# Patient Record
Sex: Male | Born: 1935 | Race: White | Hispanic: No | Marital: Single | Smoking: Former smoker
Health system: Southern US, Community
[De-identification: ages and names within clinical notes are randomized; demographics above are authoritative.]

## PROBLEM LIST (undated history)

## (undated) DIAGNOSIS — K219 Gastro-esophageal reflux disease without esophagitis: Secondary | ICD-10-CM

## (undated) DIAGNOSIS — I4891 Unspecified atrial fibrillation: Secondary | ICD-10-CM

## (undated) HISTORY — PX: PACEMAKER PLACEMENT: SHX43

---

## 2015-08-17 ENCOUNTER — Observation Stay
Admission: EM | Admit: 2015-08-17 | Discharge: 2015-08-18 | Disposition: A | Discharge status: Home / Self Care | Payer: Medicare Other | Attending: Specialist | Admitting: Specialist

## 2015-08-17 ENCOUNTER — Encounter: Payer: Self-pay | Admitting: Emergency Medicine

## 2015-08-17 ENCOUNTER — Emergency Department: Payer: Medicare Other

## 2015-08-17 DIAGNOSIS — R05 Cough: Secondary | ICD-10-CM | POA: Diagnosis not present

## 2015-08-17 DIAGNOSIS — I482 Chronic atrial fibrillation: Secondary | ICD-10-CM | POA: Diagnosis not present

## 2015-08-17 DIAGNOSIS — I10 Essential (primary) hypertension: Secondary | ICD-10-CM | POA: Insufficient documentation

## 2015-08-17 DIAGNOSIS — E785 Hyperlipidemia, unspecified: Secondary | ICD-10-CM | POA: Insufficient documentation

## 2015-08-17 DIAGNOSIS — Z87891 Personal history of nicotine dependence: Secondary | ICD-10-CM | POA: Insufficient documentation

## 2015-08-17 DIAGNOSIS — Z885 Allergy status to narcotic agent status: Secondary | ICD-10-CM | POA: Diagnosis not present

## 2015-08-17 DIAGNOSIS — J111 Influenza due to unidentified influenza virus with other respiratory manifestations: Secondary | ICD-10-CM | POA: Diagnosis not present

## 2015-08-17 DIAGNOSIS — R51 Headache: Secondary | ICD-10-CM | POA: Insufficient documentation

## 2015-08-17 DIAGNOSIS — N400 Enlarged prostate without lower urinary tract symptoms: Secondary | ICD-10-CM | POA: Diagnosis not present

## 2015-08-17 DIAGNOSIS — I2510 Atherosclerotic heart disease of native coronary artery without angina pectoris: Secondary | ICD-10-CM | POA: Insufficient documentation

## 2015-08-17 DIAGNOSIS — F329 Major depressive disorder, single episode, unspecified: Secondary | ICD-10-CM | POA: Insufficient documentation

## 2015-08-17 DIAGNOSIS — R531 Weakness: Secondary | ICD-10-CM | POA: Insufficient documentation

## 2015-08-17 DIAGNOSIS — I517 Cardiomegaly: Secondary | ICD-10-CM | POA: Diagnosis not present

## 2015-08-17 DIAGNOSIS — J069 Acute upper respiratory infection, unspecified: Secondary | ICD-10-CM | POA: Diagnosis present

## 2015-08-17 DIAGNOSIS — Z888 Allergy status to other drugs, medicaments and biological substances status: Secondary | ICD-10-CM | POA: Insufficient documentation

## 2015-08-17 DIAGNOSIS — R509 Fever, unspecified: Secondary | ICD-10-CM | POA: Insufficient documentation

## 2015-08-17 DIAGNOSIS — R0602 Shortness of breath: Secondary | ICD-10-CM | POA: Insufficient documentation

## 2015-08-17 DIAGNOSIS — Z950 Presence of cardiac pacemaker: Secondary | ICD-10-CM | POA: Diagnosis not present

## 2015-08-17 DIAGNOSIS — Z880 Allergy status to penicillin: Secondary | ICD-10-CM | POA: Diagnosis not present

## 2015-08-17 DIAGNOSIS — J209 Acute bronchitis, unspecified: Secondary | ICD-10-CM | POA: Diagnosis present

## 2015-08-17 DIAGNOSIS — K219 Gastro-esophageal reflux disease without esophagitis: Secondary | ICD-10-CM | POA: Insufficient documentation

## 2015-08-17 HISTORY — DX: Unspecified atrial fibrillation: I48.91

## 2015-08-17 HISTORY — DX: Gastro-esophageal reflux disease without esophagitis: K21.9

## 2015-08-17 LAB — BASIC METABOLIC PANEL
ANION GAP: 4 — AB (ref 5–15)
BUN: 13 mg/dL (ref 6–20)
CALCIUM: 8.2 mg/dL — AB (ref 8.9–10.3)
CO2: 24 mmol/L (ref 22–32)
Chloride: 107 mmol/L (ref 101–111)
Creatinine, Ser: 0.98 mg/dL (ref 0.61–1.24)
Glucose, Bld: 115 mg/dL — ABNORMAL HIGH (ref 65–99)
Potassium: 3.9 mmol/L (ref 3.5–5.1)
SODIUM: 135 mmol/L (ref 135–145)

## 2015-08-17 LAB — CBC
HCT: 36.9 % — ABNORMAL LOW (ref 40.0–52.0)
HEMOGLOBIN: 12.5 g/dL — AB (ref 13.0–18.0)
MCH: 33.3 pg (ref 26.0–34.0)
MCHC: 33.8 g/dL (ref 32.0–36.0)
MCV: 98.5 fL (ref 80.0–100.0)
Platelets: 119 10*3/uL — ABNORMAL LOW (ref 150–440)
RBC: 3.74 MIL/uL — AB (ref 4.40–5.90)
RDW: 12.9 % (ref 11.5–14.5)
WBC: 8.1 10*3/uL (ref 3.8–10.6)

## 2015-08-17 LAB — URINALYSIS COMPLETE WITH MICROSCOPIC (ARMC ONLY)
BILIRUBIN URINE: NEGATIVE
Bacteria, UA: NONE SEEN
Glucose, UA: NEGATIVE mg/dL
Hgb urine dipstick: NEGATIVE
KETONES UR: NEGATIVE mg/dL
Leukocytes, UA: NEGATIVE
NITRITE: NEGATIVE
PH: 6 (ref 5.0–8.0)
PROTEIN: NEGATIVE mg/dL
SPECIFIC GRAVITY, URINE: 1.018 (ref 1.005–1.030)
Squamous Epithelial / LPF: NONE SEEN

## 2015-08-17 LAB — RAPID INFLUENZA A&B ANTIGENS
Influenza A (ARMC): NEGATIVE
Influenza B (ARMC): NEGATIVE

## 2015-08-17 LAB — TROPONIN I: Troponin I: 0.03 ng/mL (ref <0.031)

## 2015-08-17 LAB — LACTIC ACID, PLASMA: LACTIC ACID, VENOUS: 1.6 mmol/L (ref 0.5–2.0)

## 2015-08-17 MED ORDER — TOPIRAMATE 25 MG PO TABS
50.0000 mg | ORAL_TABLET | Freq: Every day | ORAL | Status: DC
  Administered 2015-08-18: 10:00:00 50 mg via ORAL
  Filled 2015-08-17: qty 2

## 2015-08-17 MED ORDER — ESCITALOPRAM OXALATE 10 MG PO TABS
10.0000 mg | ORAL_TABLET | Freq: Every day | ORAL | Status: DC
  Administered 2015-08-18: 10 mg via ORAL
  Filled 2015-08-17: qty 1

## 2015-08-17 MED ORDER — ALBUTEROL SULFATE (2.5 MG/3ML) 0.083% IN NEBU: 2.5000 mg | INHALATION_SOLUTION | RESPIRATORY_TRACT | Status: DC | PRN

## 2015-08-17 MED ORDER — ACETAMINOPHEN 325 MG PO TABS
650.0000 mg | ORAL_TABLET | Freq: Four times a day (QID) | ORAL | Status: DC | PRN
  Administered 2015-08-18: 650 mg via ORAL
  Filled 2015-08-17: qty 2

## 2015-08-17 MED ORDER — SODIUM CHLORIDE 0.9 % IV SOLN
250.0000 mL | INTRAVENOUS | Status: DC | PRN
Start: 2015-08-17 — End: 2015-08-18

## 2015-08-17 MED ORDER — PREGABALIN 50 MG PO CAPS
50.0000 mg | ORAL_CAPSULE | Freq: Two times a day (BID) | ORAL | Status: DC
  Administered 2015-08-18 (×2): 50 mg via ORAL
  Filled 2015-08-17 (×2): qty 1

## 2015-08-17 MED ORDER — SODIUM CHLORIDE 0.9% FLUSH
3.0000 mL | INTRAVENOUS | Status: DC | PRN
Start: 2015-08-17 — End: 2015-08-18

## 2015-08-17 MED ORDER — ACETAMINOPHEN 650 MG RE SUPP: 650.0000 mg | Freq: Four times a day (QID) | RECTAL | Status: DC | PRN

## 2015-08-17 MED ORDER — ONDANSETRON HCL 4 MG PO TABS: 4.0000 mg | ORAL_TABLET | Freq: Four times a day (QID) | ORAL | Status: DC | PRN

## 2015-08-17 MED ORDER — GUAIFENESIN 100 MG/5ML PO SOLN
5.0000 mL | ORAL | Status: DC | PRN
  Administered 2015-08-18: 5 mL via ORAL
  Filled 2015-08-17: qty 10

## 2015-08-17 MED ORDER — ONDANSETRON HCL 4 MG/2ML IJ SOLN: 4.0000 mg | Freq: Four times a day (QID) | INTRAMUSCULAR | Status: DC | PRN

## 2015-08-17 MED ORDER — SIMVASTATIN 40 MG PO TABS: 40.0000 mg | ORAL_TABLET | Freq: Every evening | ORAL | Status: DC

## 2015-08-17 MED ORDER — TAMSULOSIN HCL 0.4 MG PO CAPS
0.4000 mg | ORAL_CAPSULE | Freq: Two times a day (BID) | ORAL | Status: DC
  Administered 2015-08-18 (×2): 0.4 mg via ORAL
  Filled 2015-08-17: qty 1

## 2015-08-17 MED ORDER — PIPERACILLIN-TAZOBACTAM 3.375 G IVPB 30 MIN
3.3750 g | Freq: Once | INTRAVENOUS | Status: DC
  Filled 2015-08-17: qty 50

## 2015-08-17 MED ORDER — SODIUM CHLORIDE 0.9% FLUSH
3.0000 mL | Freq: Two times a day (BID) | INTRAVENOUS | Status: DC
  Administered 2015-08-18 (×2): 3 mL via INTRAVENOUS

## 2015-08-17 MED ORDER — PANTOPRAZOLE SODIUM 20 MG PO TBEC: 20.0000 mg | DELAYED_RELEASE_TABLET | Freq: Every day | ORAL | Status: DC

## 2015-08-17 MED ORDER — DEXTROSE 5 % IV SOLN
500.0000 mg | INTRAVENOUS | Status: DC
  Administered 2015-08-18: 02:00:00 500 mg via INTRAVENOUS
  Filled 2015-08-17 (×2): qty 500

## 2015-08-17 MED ORDER — IPRATROPIUM-ALBUTEROL 0.5-2.5 (3) MG/3ML IN SOLN
3.0000 mL | Freq: Once | RESPIRATORY_TRACT | Status: AC
  Administered 2015-08-17: 3 mL via RESPIRATORY_TRACT
  Filled 2015-08-17: qty 3

## 2015-08-17 MED ORDER — IPRATROPIUM BROMIDE 0.02 % IN SOLN
RESPIRATORY_TRACT | Status: AC
  Filled 2015-08-17: qty 2.5

## 2015-08-17 MED ORDER — SODIUM CHLORIDE 0.9 % IV BOLUS (SEPSIS)
1000.0000 mL | Freq: Once | INTRAVENOUS | Status: AC
  Administered 2015-08-17: 1000 mL via INTRAVENOUS

## 2015-08-17 MED ORDER — ZOLPIDEM TARTRATE 5 MG PO TABS
10.0000 mg | ORAL_TABLET | Freq: Every evening | ORAL | Status: DC | PRN
  Administered 2015-08-18: 01:00:00 10 mg via ORAL
  Filled 2015-08-17: qty 2

## 2015-08-17 MED ORDER — OSELTAMIVIR PHOSPHATE 75 MG PO CAPS
75.0000 mg | ORAL_CAPSULE | Freq: Two times a day (BID) | ORAL | Status: DC
  Administered 2015-08-18 (×2): 75 mg via ORAL
  Filled 2015-08-17 (×2): qty 1

## 2015-08-17 MED ORDER — ACETAMINOPHEN 500 MG PO TABS
1000.0000 mg | ORAL_TABLET | Freq: Once | ORAL | Status: AC
  Administered 2015-08-17: 1000 mg via ORAL
  Filled 2015-08-17: qty 2

## 2015-08-17 MED ORDER — CARVEDILOL 12.5 MG PO TABS
25.0000 mg | ORAL_TABLET | Freq: Two times a day (BID) | ORAL | Status: DC
  Administered 2015-08-18: 25 mg via ORAL
  Filled 2015-08-17: qty 2

## 2015-08-17 MED ORDER — FINASTERIDE 5 MG PO TABS
5.0000 mg | ORAL_TABLET | Freq: Every day | ORAL | Status: DC
  Administered 2015-08-18: 5 mg via ORAL
  Filled 2015-08-17: qty 1

## 2015-08-17 MED ORDER — VANCOMYCIN HCL IN DEXTROSE 1-5 GM/200ML-% IV SOLN
1000.0000 mg | Freq: Once | INTRAVENOUS | Status: AC
  Administered 2015-08-17: 1000 mg via INTRAVENOUS
  Filled 2015-08-17: qty 200

## 2015-08-17 NOTE — H&P (Addendum)
[facility] - [place] at [facility]   PATIENT NAME: Joshua Rasmussen    MR#:  [id number]  DATE OF BIRTH:  1932/10/18  DATE OF ADMISSION:  08/17/2015  PRIMARY CARE PHYSICIAN: No PCP Per Patient   REQUESTING/REFERRING PHYSICIAN: Minna AntisKevin Paduchowski, MD  CHIEF COMPLAINT:   Chief Complaint  Patient presents with  . Weakness  Fever or chills and weakness for 2 days.  HISTORY OF PRESENT ILLNESS:  Joshua DoomsWilliam Dezarn  is a 82 y.o. male with a known history of A. fib and GERD. The patient started to have cough yesterday. He has fever, chills and generalized weakness today. So he came to ED for further evaluation. He was found to have a fever of 102.1, cough, shortness of breath and wheezing in the ED. Chest x-ray didn't show any infiltrate. His wife recently got a flu. [place] from [place].  PAST MEDICAL HISTORY:   Past Medical History  Diagnosis Date  . Atrial fibrillation (HCC)   . GERD (gastroesophageal reflux disease)     PAST SURGICAL HISTORY:   Past Surgical History  Procedure Laterality Date  . Pacemaker placement      SOCIAL HISTORY:   Social History  Substance Use Topics  . Smoking status: Former Games developermoker  . Smokeless tobacco: Not on file  . Alcohol Use: Yes    FAMILY HISTORY:  History reviewed. No pertinent family history. Both parents deceased.  DRUG ALLERGIES:   Allergies  Allergen Reactions  . Amiodarone   . Codeine   . Penicillins Rash    REVIEW OF SYSTEMS:  CONSTITUTIONAL: Has fever, chills and generalized weakness.  EYES: No blurred or double vision.  EARS, NOSE, AND THROAT: No tinnitus or ear pain.  RESPIRATORY: Has cough, shortness of breath, wheezing but no hemoptysis.  CARDIOVASCULAR: No chest pain, orthopnea, edema.  GASTROINTESTINAL: No nausea, vomiting, diarrhea or abdominal pain.  GENITOURINARY: No dysuria, hematuria.  ENDOCRINE: No polyuria, nocturia,  HEMATOLOGY: No anemia, easy bruising or bleeding SKIN:  No rash or lesion. MUSCULOSKELETAL: No joint pain or arthritis.   NEUROLOGIC: No tingling, numbness, weakness.  PSYCHIATRY: No anxiety or depression.   MEDICATIONS AT HOME:   Prior to Admission medications   Not on File      VITAL SIGNS:  Blood pressure 111/80, pulse 70, temperature 102.1 F (38.9 C), temperature source Oral, resp. rate 18, height 6\' 4"  (1.93 m), weight 125.556 kg (276 lb 12.8 oz), SpO2 94 %.  PHYSICAL EXAMINATION:  GENERAL:  82 y.o.-year-old patient lying in the bed with no acute distress. Morbidly obese. EYES: Pupils equal, round, reactive to light and accommodation. No scleral icterus. Extraocular muscles intact.  HEENT: Head atraumatic, normocephalic. Oropharynx and nasopharynx clear.  NECK:  Supple, no jugular venous distention. No thyroid enlargement, no tenderness.  LUNGS: Normal breath sounds bilaterally, mild expiratory wheezing, no rales,rhonchi or crepitation. No use of accessory muscles of respiration.  CARDIOVASCULAR: S1, S2 normal. No murmurs, rubs, or gallops.  ABDOMEN: Soft, nontender, nondistended. Bowel sounds present. No organomegaly or mass.  EXTREMITIES: No pedal edema, cyanosis, or clubbing.  NEUROLOGIC: Cranial nerves II through XII are intact. Muscle strength 5/5 in all extremities. Sensation intact. Gait not checked.  PSYCHIATRIC: The patient is alert and oriented x 3.  SKIN: No obvious rash, lesion, or ulcer.   LABORATORY PANEL:   CBC  Recent Labs Lab 08/17/15 1851  WBC 8.1  HGB 12.5*  HCT 36.9*  PLT 119*   ------------------------------------------------------------------------------------------------------------------  Chemistries   Recent Labs Lab 08/17/15  1851  NA 135  K 3.9  CL 107  CO2 24  GLUCOSE 115*  BUN 13  CREATININE 0.98  CALCIUM 8.2*   ------------------------------------------------------------------------------------------------------------------  Cardiac Enzymes  Recent Labs Lab 08/17/15 1851   TROPONINI 0.03   ------------------------------------------------------------------------------------------------------------------  RADIOLOGY:  Dg Chest Portable 1 View  08/17/2015  CLINICAL DATA:  Weakness, shortness of Breath EXAM: PORTABLE CHEST 1 VIEW COMPARISON:  None. FINDINGS: Cardiomegaly is noted. 3 leads cardiac pacemaker in place. No acute infiltrate or pleural effusion. No pulmonary edema. Mild basilar atelectasis. IMPRESSION: Cardiomegaly. Three leads cardiac pacemaker in place. No infiltrate or pulmonary edema. Mild basilar atelectasis. Electronically Signed   By: Natasha Mead M.D.   On: 08/17/2015 19:04    EKG:   Orders placed or performed during the hospital encounter of 08/17/15  . ED EKG  . ED EKG    IMPRESSION AND PLAN:  Patient will be placed for observation.  Acute bronchitis Start Zithromax, DuoNeb when necessary and Robitussin when necessary.  Fever, Possible influenza Check influenza PCR test and start telemetry floor.  History of A. fib status post pacemaker placement. Continue Coreg. Continue Coumadin dose per pharmacy, follow-up INR.   All the records are reviewed and case discussed with ED provider. Management plans discussed with the patient, his wife and they are in agreement.  CODE STATUS: DO NOT RESUSCITATE  TOTAL TIME TAKING CARE OF THIS PATIENT: 48 minutes.    Shaune Pollack M.D on 08/17/2015 at 9:27 PM  Between 7am to 6pm - Pager - [phone]  After 6pm go to [email or web address] - password [email or web address] [facility]  [place] [place]  Office  [phone]  CC: Primary care physician; No PCP Per Patient

## 2015-08-17 NOTE — ED Provider Notes (Signed)
[facility] Emergency Department Provider Note  Time seen: 7:16 PM  I have reviewed the triage vital signs and the nursing notes.   HISTORY  Chief Complaint Weakness    HPI Joshua Rasmussen is a 82 y.o. male with a past medical history with a history of gastric reflux, depression, CAD, hypertension, atrial fibrillation, BPH, chronic headaches, presents the emergency department with generalized weakness and fever. According to the patient he began coughing with a fever yesterday. States his weakness has progressed, today the patient was unable to get off the couch by himself or with his wife helping. EMS was called and brought the patient to the emergency department. Patient states subjective fever at home, cough with occasional sputum production. Denies chest pain. Denies abdominal pain, nausea, vomiting, diarrhea, dysuria. Patient states significant cough and congestion. Describes his weakness as severe.     No past medical history on file.  There are no active problems to display for this patient.   No past surgical history on file.  No current outpatient prescriptions on file.  Allergies Amiodarone and Codeine  No family history on file.  Social History Social History  Substance Use Topics  . Smoking status: Not on file  . Smokeless tobacco: Not on file  . Alcohol Use: Not on file    Review of Systems Constitutional: Subjective fever. Positive for congestion. Cardiovascular: Negative for chest pain. Respiratory: Positive for shortness of breath and cough. Gastrointestinal: Negative for abdominal pain, vomiting and diarrhea. Genitourinary: Negative for dysuria. Neurological: Positive for generalized weakness. No focal deficits per patient. 10-point ROS otherwise negative.  ____________________________________________   PHYSICAL EXAM:  VITAL SIGNS: ED Triage Vitals  Enc Vitals Group     BP 08/17/15 1833 139/57 mmHg     Pulse Rate  08/17/15 1833 70     Resp 08/17/15 1833 20     Temp 08/17/15 1833 101.6 F (38.7 C)     Temp Source 08/17/15 1833 Oral     SpO2 08/17/15 1833 95 %     Weight 08/17/15 1833 276 lb 12.8 oz (125.556 kg)     Height 08/17/15 1833  (1.93 m)     Head Cir --      Peak Flow --      Pain Score --      Pain Loc --      Pain Edu? --      Excl. in GC? --     Constitutional: Alert and oriented. Appears weak, fatigued. But no acute distress. Eyes: Normal exam ENT   Head: Normocephalic and atraumatic.   Mouth/Throat: Mucous membranes are moist. Cardiovascular: Normal rate, regular rhythm.  Respiratory: Tachypnea around 22-24 breaths per minute. Mild diffuse expiratory wheeze. Gastrointestinal: Soft and nontender. No distention. Musculoskeletal: Nontender with normal range of motion in all extremities. Neurologic:  Normal speech and language. No gross focal neurologic deficits Psychiatric: Mood and affect are normal. Speech and behavior are normal.   ____________________________________________    EKG  EKG reviewed and interpreted by myself shows a ventricular paced rhythm at 70 bpm.  ____________________________________________    RADIOLOGY  Chest x-ray shows cardiomegaly, no infiltrate or pulmonary edema.  ____________________________________________    INITIAL IMPRESSION / ASSESSMENT AND PLAN / ED COURSE  Pertinent labs & imaging results that were available during my care of the patient were reviewed by me and considered in my medical decision making (see chart for details).  Patient presents the emergency department cough, congestion, febrile to 101 in  the emergency department. Chest x-ray is negative. Patient has diffuse expiratory wheeze, we will treat with a breathing treatment. Given his fever, cough and congestion I also send an influenza swab. Patient does meet sepsis criteria due to tachypnea and fever. We will cover with antibiotics until the clinical picture  becomes more clear.  Patient's labs are very reassuring. Patient continues with oxygen levels ranging from 92-95 percent on room air. Patient does desat with exertion. Patient continues to be tachypnea currently around 25 breaths per minute. Continues with wheeze. I ordered a DuoNeb. Given his fever, generalized weakness tachypnea and intermittent hypoxia with exertion we will admit to the hospital for further treatment.    ____________________________________________   FINAL CLINICAL IMPRESSION(S) / ED DIAGNOSES  Generalized weakness Upper respiratory infection Fever   Minna AntisKevin Miguel Christiana, MD 08/17/15 2048

## 2015-08-17 NOTE — ED Notes (Signed)
Pt presents to ED with c/o weakness today. Pt states " I didn't think I had the strength to stand up". Alert and oriented x4 on arrival

## 2015-08-18 DIAGNOSIS — J111 Influenza due to unidentified influenza virus with other respiratory manifestations: Secondary | ICD-10-CM | POA: Diagnosis not present

## 2015-08-18 LAB — BASIC METABOLIC PANEL
Anion gap: 3 — ABNORMAL LOW (ref 5–15)
BUN: 14 mg/dL (ref 6–20)
CHLORIDE: 108 mmol/L (ref 101–111)
CO2: 23 mmol/L (ref 22–32)
CREATININE: 0.86 mg/dL (ref 0.61–1.24)
Calcium: 7.8 mg/dL — ABNORMAL LOW (ref 8.9–10.3)
GFR calc Af Amer: >60 mL/min (ref >60)
GFR calc non Af Amer: >60 mL/min (ref >60)
GLUCOSE: 102 mg/dL — AB (ref 65–99)
POTASSIUM: 3.6 mmol/L (ref 3.5–5.1)
Sodium: 134 mmol/L — ABNORMAL LOW (ref 135–145)

## 2015-08-18 LAB — CBC
HCT: 35 % — ABNORMAL LOW (ref 40.0–52.0)
Hemoglobin: 11.8 g/dL — ABNORMAL LOW (ref 13.0–18.0)
MCH: 32.9 pg (ref 26.0–34.0)
MCHC: 33.7 g/dL (ref 32.0–36.0)
MCV: 97.8 fL (ref 80.0–100.0)
PLATELETS: 113 10*3/uL — AB (ref 150–440)
RBC: 3.57 MIL/uL — AB (ref 4.40–5.90)
RDW: 13.2 % (ref 11.5–14.5)
WBC: 6.3 10*3/uL (ref 3.8–10.6)

## 2015-08-18 LAB — LACTIC ACID, PLASMA: LACTIC ACID, VENOUS: 1.3 mmol/L (ref 0.5–2.0)

## 2015-08-18 LAB — PROTIME-INR
INR: 3.32
Prothrombin Time: 33 seconds — ABNORMAL HIGH (ref 11.4–15.0)

## 2015-08-18 LAB — INFLUENZA PANEL BY PCR (TYPE A & B)
H1N1 flu by pcr: NOT DETECTED
INFLAPCR: POSITIVE — AB
Influenza B By PCR: NEGATIVE

## 2015-08-18 LAB — APTT: APTT: 46 s — AB (ref 24–36)

## 2015-08-18 MED ORDER — PANTOPRAZOLE SODIUM 40 MG PO TBEC
40.0000 mg | DELAYED_RELEASE_TABLET | Freq: Every day | ORAL | Status: DC
  Administered 2015-08-18: 40 mg via ORAL
  Filled 2015-08-18: qty 1

## 2015-08-18 MED ORDER — BUTALBITAL-APAP-CAFFEINE 50-325-40 MG PO TABS
1.0000 | ORAL_TABLET | Freq: Four times a day (QID) | ORAL | Status: DC | PRN
  Administered 2015-08-18 (×2): 1 via ORAL
  Filled 2015-08-18 (×2): qty 1

## 2015-08-18 MED ORDER — OSELTAMIVIR PHOSPHATE 75 MG PO CAPS: 75.0000 mg | ORAL_CAPSULE | Freq: Two times a day (BID) | ORAL | Status: AC

## 2015-08-18 MED ORDER — ALBUTEROL SULFATE HFA 108 (90 BASE) MCG/ACT IN AERS: 2.0000 | INHALATION_SPRAY | Freq: Four times a day (QID) | RESPIRATORY_TRACT | Status: AC | PRN

## 2015-08-18 NOTE — Progress Notes (Signed)
Temp 101.7.  Pt had blood cultures at 7PM, is positive for Influenza A,lactic acid is 1.3 and WBC is 6.3 and has started tamiflu and ABX.  Spoke with Dr Sheryle Hailiamond and pt to receive tylenol for temperature at this time. Henriette CombsSarah Miracle Mongillo RN

## 2015-08-18 NOTE — Progress Notes (Signed)
ANTICOAGULATION CONSULT NOTE - Initial Consult  Pharmacy Consult for warfarin dosing Indication: atrial fibrillation  Allergies  Allergen Reactions  . Amiodarone Other (See Comments)    Dizziness  . Codeine Other (See Comments)    Lethargic  . Penicillins Rash    Has patient had a PCN reaction causing immediate rash, facial/tongue/throat swelling, SOB or lightheadedness with hypotension: Yes Has patient had a PCN reaction causing severe rash involving mucus membranes or skin necrosis: No Has patient had a PCN reaction that required hospitalization No Has patient had a PCN reaction occurring within the last 10 years: No If all of the above answers are "NO", then may proceed with Cephalosporin use.    Patient Measurements: Height: 6\' 4"  (193 cm) Weight: 276 lb 12.8 oz (125.556 kg) IBW/kg (Calculated) : 86.8 Heparin Dosing Weight: n/a  Vital Signs: Temp: 100 F (37.8 C) (03/25 2344) Temp Source: Oral (03/25 2344) BP: 126/66 mmHg (03/25 2344) Pulse Rate: 70 (03/25 2344)  Labs:  Recent Labs  08/17/15 1851  HGB 12.5*  HCT 36.9*  PLT 119*  CREATININE 0.98  TROPONINI 0.03    Estimated Creatinine Clearance: 85.5 mL/min (by C-G formula based on Cr of 0.98).   Medical History: Past Medical History  Diagnosis Date  . Atrial fibrillation (HCC)   . GERD (gastroesophageal reflux disease)     Medications:    Assessment: Hgb 12.5  INR 3.32 Home regimen is 7.5 mg nightly.  Goal of Therapy:  INR 2-3    Plan:  INR 3.32. Hold today's dose and recheck INR tomorrow AM.   Joshua Rasmussen S 08/18/2015,1:18 AM

## 2015-08-18 NOTE — Progress Notes (Signed)
Pt is being discharged today. Paperwork reviewed with him, he verified understanding. Pt belongings returned, IV removed. He is waiting on his ride and will be wheeled out in a wheelchair by staff.

## 2015-08-18 NOTE — Care Management Obs Status (Signed)
MEDICARE OBSERVATION STATUS NOTIFICATION   Patient Details  Name: Joshua Rasmussen MRN: [id number] Date of Birth: May 25, 1933   Medicare Observation Status Notification Given:  No (discharged < 24 hours)    Caren MacadamMichelle Marika Mahaffy, RN 08/18/2015, 1:44 PM

## 2015-08-18 NOTE — Discharge Summary (Signed)
[facility] - [place] at [facility]   PATIENT NAME: Joshua Rasmussen    MR#:  [id number]  DATE OF BIRTH:  1932-10-19  DATE OF ADMISSION:  08/17/2015 ADMITTING PHYSICIAN: Shaune Pollack, MD  DATE OF DISCHARGE: 08/18/2015  1:19 PM  PRIMARY CARE PHYSICIAN: No PCP Per Patient    ADMISSION DIAGNOSIS:  URI (upper respiratory infection) [J06.9] Generalized weakness [R53.1] Fever, unspecified fever cause [R50.9]  DISCHARGE DIAGNOSIS:  Principal Problem:   Acute bronchitis   SECONDARY DIAGNOSIS:   Past Medical History  Diagnosis Date  . Atrial fibrillation (HCC)   . GERD (gastroesophageal reflux disease)     HOSPITAL COURSE:   82 year old male with a past smoker history of chronic atrial fibrillation, GERD, status post pacemaker, hyperlipidemia, BPH who presented to the hospital due to fever, chills and generalized weakness and noted to be positive for the flu.  #1 Flu-patient was positive for influenza A PCR. Patient's clinical symptoms of fever, chills, generalized weakness was secondary to the flu. He was treated supportively with IV fluids, Tamiflu and has improved. -He is currently afebrile and feels better and therefore being discharged home on oral Tamiflu, albuterol inhaler as needed.  #2 chronic afibrillation-rate controlled. Patient will continue his Coreg, warfarin.  #3 hyperlipidemia-patient will continue his simvastatin.  #4 BPH-no evidence of urinary retention. Patient will continue his Flomax, finasteride.  #5 neuropathy-patient will resume his Lyrica.  Patient is being discharged home and will follow up with his primary care physician in [place].  DISCHARGE CONDITIONS:   Stable.   CONSULTS OBTAINED:     DRUG ALLERGIES:   Allergies  Allergen Reactions  . Amiodarone Other (See Comments)    Dizziness  . Codeine Other (See Comments)    Lethargic  . Penicillins Rash    Has patient had a PCN reaction causing immediate rash,  facial/tongue/throat swelling, SOB or lightheadedness with hypotension: Yes Has patient had a PCN reaction causing severe rash involving mucus membranes or skin necrosis: No Has patient had a PCN reaction that required hospitalization No Has patient had a PCN reaction occurring within the last 10 years: No If all of the above answers are "NO", then may proceed with Cephalosporin use.    DISCHARGE MEDICATIONS:   Discharge Medication List as of 08/18/2015 11:59 AM    START taking these medications   Details  albuterol (PROVENTIL HFA;VENTOLIN HFA) 108 (90 Base) MCG/ACT inhaler Inhale 2 puffs into the lungs every 6 (six) hours as needed for wheezing or shortness of breath., Starting 08/18/2015, Until Discontinued, Print    oseltamivir (TAMIFLU) 75 MG capsule Take 1 capsule (75 mg total) by mouth 2 (two) times daily., Starting 08/18/2015, Until Discontinued, Print      CONTINUE these medications which have NOT CHANGED   Details  carvedilol (COREG) 25 MG tablet Take 25 mg by mouth 2 (two) times daily with a meal., Until Discontinued, Historical Med    escitalopram (LEXAPRO) 10 MG tablet Take 10 mg by mouth daily., Until Discontinued, Historical Med    finasteride (PROSCAR) 5 MG tablet Take 5 mg by mouth at bedtime., Until Discontinued, Historical Med    lansoprazole (PREVACID) 30 MG capsule Take 30 mg by mouth 2 (two) times daily before a meal., Until Discontinued, Historical Med    pregabalin (LYRICA) 50 MG capsule Take 50 mg by mouth 2 (two) times daily., Until Discontinued, Historical Med    simvastatin (ZOCOR) 40 MG tablet Take 40 mg by mouth every evening., Until Discontinued, Historical Med  tamsulosin (FLOMAX) 0.4 MG CAPS capsule Take 0.4 mg by mouth 2 (two) times daily., Until Discontinued, Historical Med    topiramate (TOPAMAX) 50 MG tablet Take 50 mg by mouth daily., Until Discontinued, Historical Med    warfarin (COUMADIN) 7.5 MG tablet Take 7.5 mg by mouth at bedtime. , Until  Discontinued, Historical Med    zolpidem (AMBIEN) 10 MG tablet Take 10 mg by mouth at bedtime as needed for sleep., Until Discontinued, Historical Med         DISCHARGE INSTRUCTIONS:   DIET:  Cardiac diet  DISCHARGE CONDITION:  Stable  ACTIVITY:  Activity as tolerated  OXYGEN:  Home Oxygen: No.   Oxygen Delivery: room air  DISCHARGE LOCATION:  home   If you experience worsening of your admission symptoms, develop shortness of breath, life threatening emergency, suicidal or homicidal thoughts you must seek medical attention immediately by calling 911 or calling your MD immediately  if symptoms less severe.  You Must read complete instructions/literature along with all the possible adverse reactions/side effects for all the Medicines you take and that have been prescribed to you. Take any new Medicines after you have completely understood and accpet all the possible adverse reactions/side effects.   Please note  You were cared for by a hospitalist during your hospital stay. If you have any questions about your discharge medications or the care you received while you were in the hospital after you are discharged, you can call the unit and asked to speak with the hospitalist on call if the hospitalist that took care of you is not available. Once you are discharged, your primary care physician will handle any further medical issues. Please note that NO REFILLS for any discharge medications will be authorized once you are discharged, as it is imperative that you return to your primary care physician (or establish a relationship with a primary care physician if you do not have one) for your aftercare needs so that they can reassess your need for medications and monitor your lab values.     Today   Afebrile, feels better and wants to go home.   VITAL SIGNS:  Blood pressure 121/56, pulse 70, temperature 98.7 F (37.1 C), temperature source Oral, resp. rate 20, height  (1.93  m), weight 125.51 kg (276 lb 11.2 oz), SpO2 94 %.  I/O:   Intake/Output Summary (Last 24 hours) at 08/18/15 1549 Last data filed at 08/18/15 0300  Gross per 24 hour  Intake    250 ml  Output    300 ml  Net    -50 ml    PHYSICAL EXAMINATION:  GENERAL:  82 y.o.-year-old patient lying in the bed with no acute distress.  EYES: Pupils equal, round, reactive to light and accommodation. No scleral icterus. Extraocular muscles intact.  HEENT: Head atraumatic, normocephalic. Oropharynx and nasopharynx clear.  NECK:  Supple, no jugular venous distention. No thyroid enlargement, no tenderness.  LUNGS: Normal breath sounds bilaterally,  Minimal end-exp. Wheezing, No  rales,rhonchi. No use of accessory muscles of respiration.  CARDIOVASCULAR: S1, S2 normal. No murmurs, rubs, or gallops.  ABDOMEN: Soft, non-tender, non-distended. Bowel sounds present. No organomegaly or mass.  EXTREMITIES: No pedal edema, cyanosis, or clubbing.  NEUROLOGIC: Cranial nerves II through XII are intact. No focal motor or sensory defecits b/l.  PSYCHIATRIC: The patient is alert and oriented x 3. Good affect.  SKIN: No obvious rash, lesion, or ulcer.   DATA REVIEW:   CBC  Recent Labs Lab 08/18/15  0101  WBC 6.3  HGB 11.8*  HCT 35.0*  PLT 113*    Chemistries   Recent Labs Lab 08/18/15 0101  NA 134*  K 3.6  CL 108  CO2 23  GLUCOSE 102*  BUN 14  CREATININE 0.86  CALCIUM 7.8*    Cardiac Enzymes  Recent Labs Lab 08/17/15 1851  TROPONINI 0.03    Microbiology Results  Results for orders placed or performed during the hospital encounter of 08/17/15  Rapid Influenza A&B Antigens ([facility] only)     Status: None   Collection Time: 08/17/15  7:46 PM  Result Value Ref Range Status   Influenza A ([facility]) NEGATIVE NEGATIVE Final   Influenza B ([facility]) NEGATIVE NEGATIVE Final  Urine culture     Status: None (Preliminary result)   Collection Time: 08/17/15  8:43 PM  Result Value Ref Range Status   Specimen  Description URINE, RANDOM  Final   Special Requests NONE  Final   Culture NO GROWTH < 12 HOURS  Final   Report Status PENDING  Incomplete    RADIOLOGY:  Dg Chest Portable 1 View  08/17/2015  CLINICAL DATA:  Weakness, shortness of Breath EXAM: PORTABLE CHEST 1 VIEW COMPARISON:  None. FINDINGS: Cardiomegaly is noted. 3 leads cardiac pacemaker in place. No acute infiltrate or pleural effusion. No pulmonary edema. Mild basilar atelectasis. IMPRESSION: Cardiomegaly. Three leads cardiac pacemaker in place. No infiltrate or pulmonary edema. Mild basilar atelectasis. Electronically Signed   By: Natasha MeadLiviu  Pop M.D.   On: 08/17/2015 19:04      Management plans discussed with the patient, family and they are in agreement.  CODE STATUS:     Code Status Orders        Start     Ordered   08/17/15 2353  Do not attempt resuscitation (DNR)   Continuous    Question Answer Comment  In the event of cardiac or respiratory ARREST Do not call a "code blue"   In the event of cardiac or respiratory ARREST Do not perform Intubation, CPR, defibrillation or ACLS   In the event of cardiac or respiratory ARREST Use medication by any route, position, wound care, and other measures to relive pain and suffering. May use oxygen, suction and manual treatment of airway obstruction as needed for comfort.      08/17/15 2352    Code Status History    Date Active Date Inactive Code Status Order ID Comments User Context   This patient has a current code status but no historical code status.    Advance Directive Documentation        Most Recent Value   Type of Advance Directive  Healthcare Power of Attorney   Pre-existing out of facility DNR order (yellow form or pink MOST form)     "MOST" Form in Place?        TOTAL TIME TAKING CARE OF THIS PATIENT: 40 minutes.    [place] SirenSAINANI,Chelsea Nusz J M.D on 08/18/2015 at 3:49 PM  Between 7am to 6pm - Pager - [phone]  After 6pm go to [email or web address] - password [email or web address] [facility]  Office  [phone]  CC: Primary care physician; No PCP Per Patient

## 2015-08-19 LAB — URINE CULTURE

## 2015-08-22 LAB — CULTURE, BLOOD (ROUTINE X 2)
Culture: NO GROWTH
Culture: NO GROWTH

## 2017-04-01 IMAGING — DX DG CHEST 1V PORT
1 series · 1 of 1 positions shown · non-contrast
Comparison: None.

CLINICAL DATA: Weakness, shortness of Breath

EXAM:
PORTABLE CHEST 1 VIEW

[chest ap]
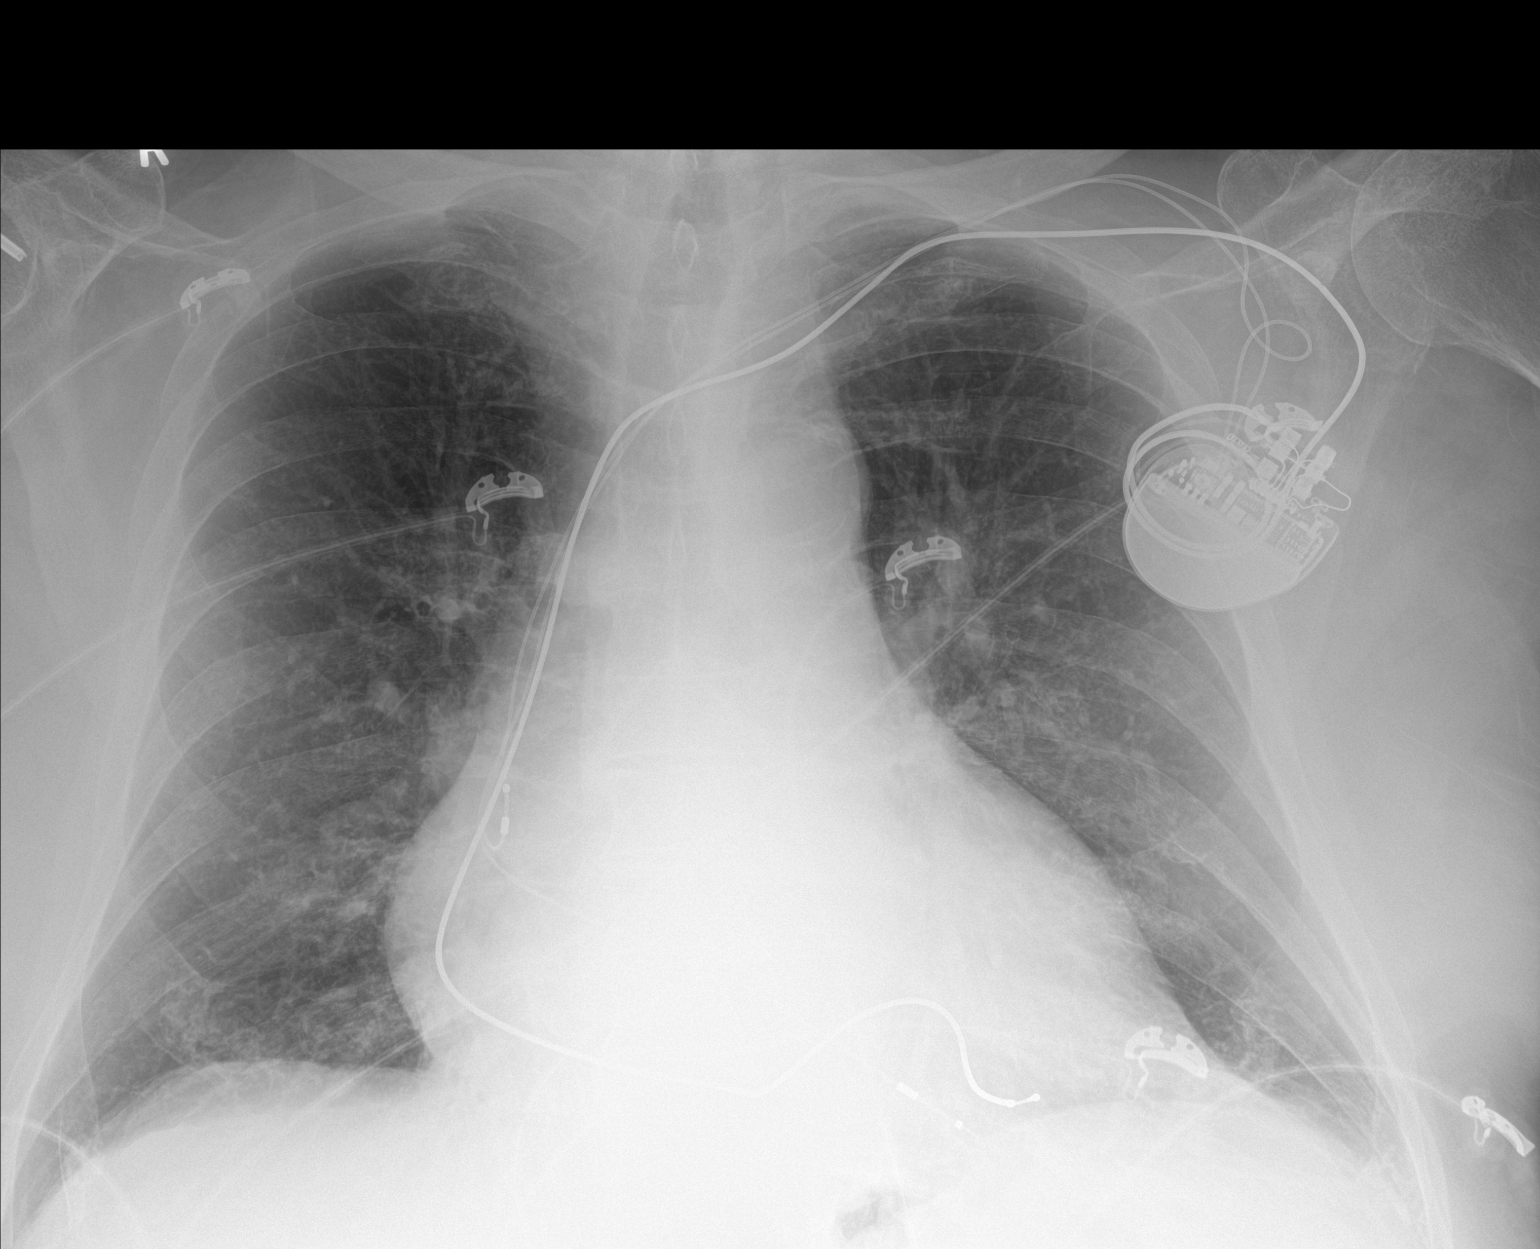

[1 of 1 positions shown; findings below may reference images not displayed]

FINDINGS: Cardiomegaly is noted. 3 leads cardiac pacemaker in place. No acute
infiltrate or pleural effusion. No pulmonary edema. Mild basilar
atelectasis.
IMPRESSION: Cardiomegaly. Three leads cardiac pacemaker in place. No infiltrate
or pulmonary edema. Mild basilar atelectasis.
